# Patient Record
Sex: Female | Born: 2000 | Race: Black or African American | Hispanic: No | Marital: Single | Smoking: Never smoker
Health system: Southern US, Community
[De-identification: ages and names within clinical notes are randomized; demographics above are authoritative.]

## PROBLEM LIST (undated history)

## (undated) DIAGNOSIS — F329 Major depressive disorder, single episode, unspecified: Secondary | ICD-10-CM

## (undated) DIAGNOSIS — F32A Depression, unspecified: Secondary | ICD-10-CM

## (undated) DIAGNOSIS — F419 Anxiety disorder, unspecified: Secondary | ICD-10-CM

## (undated) HISTORY — PX: NO PAST SURGERIES: SHX2092

## (undated) HISTORY — DX: Major depressive disorder, single episode, unspecified: F32.9

## (undated) HISTORY — DX: Anxiety disorder, unspecified: F41.9

## (undated) HISTORY — DX: Depression, unspecified: F32.A

---
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
## 2018-04-12 ENCOUNTER — Ambulatory Visit (HOSPITAL_COMMUNITY)
Admission: EM | Admit: 2018-04-12 | Discharge: 2018-04-12 | Disposition: A | Discharge status: Home / Self Care | Payer: Medicaid Other | Attending: Family Medicine | Admitting: Family Medicine

## 2018-04-12 ENCOUNTER — Other Ambulatory Visit: Payer: Self-pay

## 2018-04-12 ENCOUNTER — Encounter (HOSPITAL_COMMUNITY): Payer: Self-pay | Admitting: *Deleted

## 2018-04-12 DIAGNOSIS — N760 Acute vaginitis: Secondary | ICD-10-CM

## 2018-04-12 DIAGNOSIS — Z79899 Other long term (current) drug therapy: Secondary | ICD-10-CM | POA: Diagnosis not present

## 2018-04-12 DIAGNOSIS — N898 Other specified noninflammatory disorders of vagina: Secondary | ICD-10-CM | POA: Diagnosis present

## 2018-04-12 NOTE — Discharge Instructions (Signed)
We are testing you today for yeast, bacteria, gonorrhea, chlamydia. Will notify you of any positive findings and if any changes to treatment are needed.   Avoid putting any soap or products in the vagina.  If symptoms worsen or do not improve in the next week to return to be seen or to follow up with your PCP.

## 2018-04-12 NOTE — ED Triage Notes (Signed)
C/o vaginal discharge with odor onset 2 weeks ago

## 2018-04-12 NOTE — ED Provider Notes (Signed)
[facility]    CSN: [id number] Arrival date & time: 04/12/18  1303     History   Chief Complaint Chief Complaint  Patient presents with  . Vaginal Discharge    HPI Tiffany Harris is a 17 y.o. female.   [name] presents with complaints of vaginal odor with some itching and discomfort which started at least 2 weeks ago. States has had clear vaginal discharge. Denies any previous similar. No sores or lesions. No vaginal bleeding. LMP 10/7 approximately. Last sexually active a month ago, used condoms. Denies concern for STD's. Normal urination. No new back pain. No pelvic pain. Has not tried any medications for symptoms. Doesn't have a PCP, denies any previous pelvic exams or PAP's. Without contributing medical history.      ROS per HPI.      Past Medical History:  Diagnosis Date  . Depression     Patient Active Problem List   Diagnosis Date Noted  . Chest pain 08/17/2017  . Chronic pain of both knees 08/17/2017    History reviewed. No pertinent surgical history.  OB History   None      Home Medications    Prior to Admission medications   Medication Sig Start Date End Date Taking? Authorizing Provider  fluticasone (FLONASE) 50 MCG/ACT nasal spray Place 2 sprays into both nostrils daily. 08/17/17   Claiborne Rigg, NP  ibuprofen (ADVIL,MOTRIN) 800 MG tablet Take 1 tablet (800 mg total) by mouth every 8 (eight) hours as needed. 08/17/17   Claiborne Rigg, NP  tiZANidine (ZANAFLEX) 2 MG tablet Take 1 tablet (2 mg total) by mouth every 6 (six) hours as needed for muscle spasms. 08/17/17   Claiborne Rigg, NP    Family History Family History  Problem Relation Age of Onset  . Hypertension Mother   . Hypertension Maternal Aunt   . Hypertension Paternal Aunt   . Diabetes Maternal Grandmother     Social History Social History   Tobacco Use  . Smoking status: Never Smoker  . Smokeless tobacco: Never Used  Substance Use Topics  . Alcohol use: No   . Drug use: No     Allergies   Pineapple flavor [flavoring agent]   Review of Systems Review of Systems   Physical Exam Triage Vital Signs ED Triage Vitals  Enc Vitals Group     BP 04/12/18 1400 116/64     Pulse Rate 04/12/18 1400 78     Resp 04/12/18 1400 18     Temp 04/12/18 1400 97.9 F (36.6 C)     Temp Source 04/12/18 1400 Oral     SpO2 04/12/18 1400 100 %     Weight --      Height --      Head Circumference --      Peak Flow --      Pain Score 04/12/18 1401 0     Pain Loc --      Pain Edu? --      Excl. in GC? --    No data found.  Updated Vital Signs BP 116/64 (BP Location: Right Arm)   Pulse 78   Temp 97.9 F (36.6 C) (Oral)   Resp 18   LMP 03/23/2018 (Exact Date)   SpO2 100%    Physical Exam  Constitutional: She is oriented to person, place, and time. She appears well-developed and well-nourished. No distress.  Cardiovascular: Normal rate, regular rhythm and normal heart sounds.  Pulmonary/Chest: Effort normal and breath  sounds normal.  Abdominal: Soft. There is no tenderness. There is no rigidity, no rebound, no guarding and no CVA tenderness.  Genitourinary:  Genitourinary Comments: Denies sores, lesions, vaginal bleeding; no pelvic pain; gu exam deferred at this time, vaginal self swab collected.    Neurological: She is alert and oriented to person, place, and time.  Skin: Skin is warm and dry.     UC Treatments / Results  Labs (all labs ordered are listed, but only abnormal results are displayed) Labs Reviewed  CERVICOVAGINAL ANCILLARY ONLY    EKG None  Radiology No results found.  Procedures Procedures (including critical care time)  Medications Ordered in UC Medications - No data to display  Initial Impression / Assessment and Plan / UC Course  I have reviewed the triage vital signs and the nursing notes.  Pertinent labs & imaging results that were available during my care of the patient were reviewed by me and considered  in my medical decision making (see chart for details).     Vaginal odor and irritation, denies any previous similar. Discharge has been clear per patient. No pelvic pain, fevers, denies concern for STD's. Vaginal cytology self swab collected and pending. Will treat based on these results. Will notify of any positive findings and if any changes to treatment are needed.  If symptoms worsen or do not improve in the next week to return to be seen or to follow up with PCP.  Patient verbalized understanding and agreeable to plan.   Final Clinical Impressions(s) / UC Diagnoses   Final diagnoses:  Acute vaginitis     Discharge Instructions     We are testing you today for yeast, bacteria, gonorrhea, chlamydia. Will notify you of any positive findings and if any changes to treatment are needed.   Avoid putting any soap or products in the vagina.  If symptoms worsen or do not improve in the next week to return to be seen or to follow up with your PCP.     ED Prescriptions    None     Controlled Substance Prescriptions [place] Controlled Substance Registry consulted? Not Applicable   Georgetta Haber, NP 04/12/18 1443

## 2018-04-13 LAB — CERVICOVAGINAL ANCILLARY ONLY
BACTERIAL VAGINITIS: POSITIVE — AB
CANDIDA VAGINITIS: POSITIVE — AB
CHLAMYDIA, DNA PROBE: NEGATIVE
NEISSERIA GONORRHEA: NEGATIVE
TRICH (WINDOWPATH): NEGATIVE

## 2018-04-14 ENCOUNTER — Telehealth: Payer: Self-pay | Admitting: Emergency Medicine

## 2018-04-14 MED ORDER — FLUCONAZOLE 150 MG PO TABS: ORAL_TABLET | ORAL | 0 refills | Status: DC

## 2018-04-14 MED ORDER — METRONIDAZOLE 500 MG PO TABS: 500.0000 mg | ORAL_TABLET | Freq: Two times a day (BID) | ORAL | 0 refills | Status: DC

## 2018-04-14 MED FILL — FLUCONAZOLE 150 MG TABS: 150 | 3 days supply | Qty: 2 | Fill #0

## 2018-04-14 MED FILL — metroNIDAZOLE 500 MG TABS: 500 | 7 days supply | Qty: 14 | Fill #0

## 2018-04-14 NOTE — Telephone Encounter (Signed)
Called patient, results communicated.  Patient voiced understanding and asked med.s to be sent to [facility].  Test for candida (yeast) was positive.  Prescription for fluconazole 150mg  po now, repeat dose in 3d if needed, #2 no refills, sent.  Recheck or followup with PCP for further evaluation if symptoms are not improving.  Bacterial vaginosis is positive. This was not treated at the urgent care visit.  Flagyl 500 mg BID x 7 days #14 no refills sent to patients pharmacy.

## 2018-06-09 ENCOUNTER — Encounter (HOSPITAL_COMMUNITY): Payer: Self-pay | Admitting: Emergency Medicine

## 2018-06-09 ENCOUNTER — Emergency Department (HOSPITAL_COMMUNITY)
Admission: EM | Admit: 2018-06-09 | Discharge: 2018-06-09 | Disposition: A | Discharge status: Home / Self Care | Payer: Medicaid Other | Attending: Emergency Medicine | Admitting: Emergency Medicine

## 2018-06-09 DIAGNOSIS — Z79899 Other long term (current) drug therapy: Secondary | ICD-10-CM | POA: Diagnosis not present

## 2018-06-09 DIAGNOSIS — R0981 Nasal congestion: Secondary | ICD-10-CM | POA: Diagnosis not present

## 2018-06-09 DIAGNOSIS — R51 Headache: Secondary | ICD-10-CM | POA: Diagnosis not present

## 2018-06-09 DIAGNOSIS — N10 Acute tubulo-interstitial nephritis: Secondary | ICD-10-CM

## 2018-06-09 DIAGNOSIS — R112 Nausea with vomiting, unspecified: Secondary | ICD-10-CM | POA: Diagnosis present

## 2018-06-09 DIAGNOSIS — R509 Fever, unspecified: Secondary | ICD-10-CM | POA: Diagnosis not present

## 2018-06-09 DIAGNOSIS — R110 Nausea: Secondary | ICD-10-CM | POA: Diagnosis not present

## 2018-06-09 DIAGNOSIS — R630 Anorexia: Secondary | ICD-10-CM | POA: Diagnosis not present

## 2018-06-09 LAB — URINALYSIS, ROUTINE W REFLEX MICROSCOPIC
Glucose, UA: NEGATIVE mg/dL
Hgb urine dipstick: NEGATIVE
KETONES UR: 20 mg/dL — AB
Nitrite: NEGATIVE
PROTEIN: 30 mg/dL — AB
Specific Gravity, Urine: 1.033 — ABNORMAL HIGH (ref 1.005–1.030)
pH: 6 (ref 5.0–8.0)

## 2018-06-09 MED ORDER — CEPHALEXIN 500 MG PO CAPS: 500.0000 mg | ORAL_CAPSULE | Freq: Four times a day (QID) | ORAL | 0 refills | Status: DC

## 2018-06-09 MED ORDER — LIDOCAINE HCL (PF) 1 % IJ SOLN
2.1000 mL | Freq: Once | INTRAMUSCULAR | Status: AC
  Administered 2018-06-09: 2.1 mL
  Filled 2018-06-09: qty 5

## 2018-06-09 MED ORDER — KETOROLAC TROMETHAMINE 60 MG/2ML IM SOLN
60.0000 mg | Freq: Once | INTRAMUSCULAR | Status: AC
  Administered 2018-06-09: 60 mg via INTRAMUSCULAR
  Filled 2018-06-09: qty 2

## 2018-06-09 MED ORDER — ONDANSETRON HCL 4 MG PO TABS: 4.0000 mg | ORAL_TABLET | Freq: Four times a day (QID) | ORAL | 0 refills | Status: DC

## 2018-06-09 MED ORDER — ONDANSETRON 4 MG PO TBDP
8.0000 mg | ORAL_TABLET | Freq: Once | ORAL | Status: AC
  Administered 2018-06-09: 8 mg via ORAL
  Filled 2018-06-09: qty 2

## 2018-06-09 MED ORDER — CEFTRIAXONE SODIUM 1 G IJ SOLR
1.0000 g | Freq: Once | INTRAMUSCULAR | Status: AC
  Administered 2018-06-09: 1 g via INTRAMUSCULAR
  Filled 2018-06-09: qty 10

## 2018-06-09 NOTE — ED Triage Notes (Signed)
Pt here with flu like symptoms times 3 days , fever today of 103, body aches , and some slight nausea

## 2018-06-10 LAB — URINE CULTURE

## 2018-06-11 NOTE — ED Provider Notes (Signed)
[facility] Provider Note   CSN: [id number] Arrival date & time: 06/09/18  [id number]     History   Chief Complaint Chief Complaint  Patient presents with  . Influenza    HPI Tiffany Harris is a 17 y.o. female.   Influenza  Presenting symptoms: fatigue, fever, headache, myalgias and nausea   Presenting symptoms: no cough   Onset quality:  Gradual Duration:  3 days Progression:  Worsening Chronicity:  New Relieved by:  Nothing Worsened by:  Nothing Ineffective treatments:  None tried Associated symptoms: chills, decreased appetite and nasal congestion   Fever   Associated symptoms include congestion and headaches. Pertinent negatives include no cough.    Past Medical History:  Diagnosis Date  . Depression     Patient Active Problem List   Diagnosis Date Noted  . Chest pain 08/17/2017  . Chronic pain of both knees 08/17/2017    History reviewed. No pertinent surgical history.   OB History   No obstetric history on file.      Home Medications    Prior to Admission medications   Medication Sig Start Date End Date Taking? Authorizing Provider  cephALEXin (KEFLEX) 500 MG capsule Take 1 capsule (500 mg total) by mouth 4 (four) times daily. 06/09/18   Graysyn Bache, Barbara CowerJason, MD  fluconazole (DIFLUCAN) 150 MG tablet Take 1 150 mg tablet by mouth today. If still symptomatic take additional 150 mg in 72 hours. 04/14/18   Eustace MooreNelson, Yvonne Sue, MD  fluticasone [facility](FLONASE) 50 MCG/ACT nasal spray Place 2 sprays into both nostrils daily. 08/17/17   Claiborne RiggFleming, Zelda W, NP  ibuprofen (ADVIL,MOTRIN) 800 MG tablet Take 1 tablet (800 mg total) by mouth every 8 (eight) hours as needed. 08/17/17   Claiborne RiggFleming, Zelda W, NP  metroNIDAZOLE (FLAGYL) 500 MG tablet Take 1 tablet (500 mg total) by mouth 2 (two) times daily. 04/14/18   Eustace MooreNelson, Yvonne Sue, MD  ondansetron (ZOFRAN) 4 MG tablet Take 1 tablet (4 mg total) by mouth every 6 (six) hours. 06/09/18   Bryley Kovacevic,  Barbara CowerJason, MD  tiZANidine (ZANAFLEX) 2 MG tablet Take 1 tablet (2 mg total) by mouth every 6 (six) hours as needed for muscle spasms. 08/17/17   Claiborne RiggFleming, Zelda W, NP    Family History Family History  Problem Relation Age of Onset  . Hypertension Mother   . Hypertension Maternal Aunt   . Hypertension Paternal Aunt   . Diabetes Maternal Grandmother     Social History Social History   Tobacco Use  . Smoking status: Never Smoker  . Smokeless tobacco: Never Used  Substance Use Topics  . Alcohol use: No  . Drug use: No     Allergies   Pineapple flavor [flavoring agent]   Review of Systems Review of Systems  Constitutional: Positive for chills, decreased appetite, fatigue and fever.  HENT: Positive for congestion.   Respiratory: Negative for cough.   Gastrointestinal: Positive for nausea.  Musculoskeletal: Positive for myalgias.  Neurological: Positive for headaches.  All other systems reviewed and are negative.    Physical Exam Updated Vital Signs BP 111/60   Pulse 90   Temp 98.6 F (37 C) (Oral)   Resp 11   SpO2 100%   Physical Exam Vitals signs and nursing note reviewed.  Constitutional:      Appearance: She is well-developed.  HENT:     Head: Normocephalic and atraumatic.  Eyes:     Extraocular Movements: Extraocular movements intact.     Conjunctiva/sclera:  Conjunctivae normal.     Pupils: Pupils are equal, round, and reactive to light.  Neck:     Musculoskeletal: Normal range of motion.  Cardiovascular:     Rate and Rhythm: Regular rhythm. Tachycardia present.  Pulmonary:     Effort: Pulmonary effort is normal. No respiratory distress.     Breath sounds: No stridor. No wheezing.  Abdominal:     General: Abdomen is flat. There is no distension.  Neurological:     Mental Status: She is alert.      ED Treatments / Results  Labs (all labs ordered are listed, but only abnormal results are displayed) Labs Reviewed  URINE CULTURE - Abnormal; Notable  for the following components:      Result Value   Culture MULTIPLE SPECIES PRESENT, SUGGEST RECOLLECTION (*)    All other components within normal limits  URINALYSIS, ROUTINE W REFLEX MICROSCOPIC - Abnormal; Notable for the following components:   Color, Urine AMBER (*)    APPearance CLOUDY (*)    Specific Gravity, Urine 1.033 (*)    Bilirubin Urine SMALL (*)    Ketones, ur 20 (*)    Protein, ur 30 (*)    Leukocytes, UA MODERATE (*)    Bacteria, UA MANY (*)    All other components within normal limits    EKG None  Radiology No results found.  Procedures Procedures (including critical care time)  Medications Ordered in ED Medications  ketorolac (TORADOL) injection 60 mg (60 mg Intramuscular Given 06/09/18 0611)  ondansetron (ZOFRAN-ODT) disintegrating tablet 8 mg (8 mg Oral Given 06/09/18 [id number])  cefTRIAXone (ROCEPHIN) injection 1 g (1 g Intramuscular Given 06/09/18 0735)  lidocaine (PF) (XYLOCAINE) 1 % injection 2.1 mL (2.1 mLs Other Given 06/09/18 0735)     Initial Impression / Assessment and Plan / ED Course  I have reviewed the triage vital signs and the nursing notes.  Pertinent labs & imaging results that were available during my care of the patient were reviewed by me and considered in my medical decision making (see chart for details).     UTI? ILI? Improved HR/fever and symptoms with treatments here. Urine culture sent. Started abx. Will dc on same.   Final Clinical Impressions(s) / ED Diagnoses   Final diagnoses:  Acute pyelonephritis    ED Discharge Orders         Ordered    cephALEXin (KEFLEX) 500 MG capsule  4 times daily     06/09/18 0725    ondansetron (ZOFRAN) 4 MG tablet  Every 6 hours     06/09/18 0725           Gradyn Shein, Barbara CowerJason, MD 06/11/18 [phone]

## 2018-07-15 DIAGNOSIS — Z1388 Encounter for screening for disorder due to exposure to contaminants: Secondary | ICD-10-CM | POA: Diagnosis not present

## 2018-07-15 DIAGNOSIS — Z3200 Encounter for pregnancy test, result unknown: Secondary | ICD-10-CM | POA: Diagnosis not present

## 2018-07-15 DIAGNOSIS — Z30430 Encounter for insertion of intrauterine contraceptive device: Secondary | ICD-10-CM | POA: Diagnosis not present

## 2018-07-15 DIAGNOSIS — Z0389 Encounter for observation for other suspected diseases and conditions ruled out: Secondary | ICD-10-CM | POA: Diagnosis not present

## 2018-07-15 DIAGNOSIS — Z3009 Encounter for other general counseling and advice on contraception: Secondary | ICD-10-CM | POA: Diagnosis not present

## 2018-07-15 DIAGNOSIS — Z113 Encounter for screening for infections with a predominantly sexual mode of transmission: Secondary | ICD-10-CM | POA: Diagnosis not present

## 2018-07-26 ENCOUNTER — Ambulatory Visit: Payer: Medicaid Other | Attending: Family Medicine

## 2018-07-26 DIAGNOSIS — Z111 Encounter for screening for respiratory tuberculosis: Secondary | ICD-10-CM

## 2018-07-26 NOTE — Progress Notes (Signed)
Patient arrived to clinic to receive tb skin test.   Patient was given tb skin test on right forearm.  Patient is to return in 48 hours to get test read.

## 2018-07-28 ENCOUNTER — Ambulatory Visit: Payer: Medicaid Other | Attending: Family Medicine | Admitting: *Deleted

## 2018-07-28 VITALS — Temp 98.7°F

## 2018-07-28 DIAGNOSIS — Z111 Encounter for screening for respiratory tuberculosis: Secondary | ICD-10-CM

## 2018-07-28 LAB — TB SKIN TEST
Induration: 0 mm
TB SKIN TEST: NEGATIVE

## 2019-04-20 IMAGING — CR DG CHEST 2V
2 series · 2 of 2 positions shown · non-contrast
Comparison: Radiographs March 20, 2013.

CLINICAL DATA: Chest pain.

EXAM:
CHEST  2 VIEW

[chest lat]
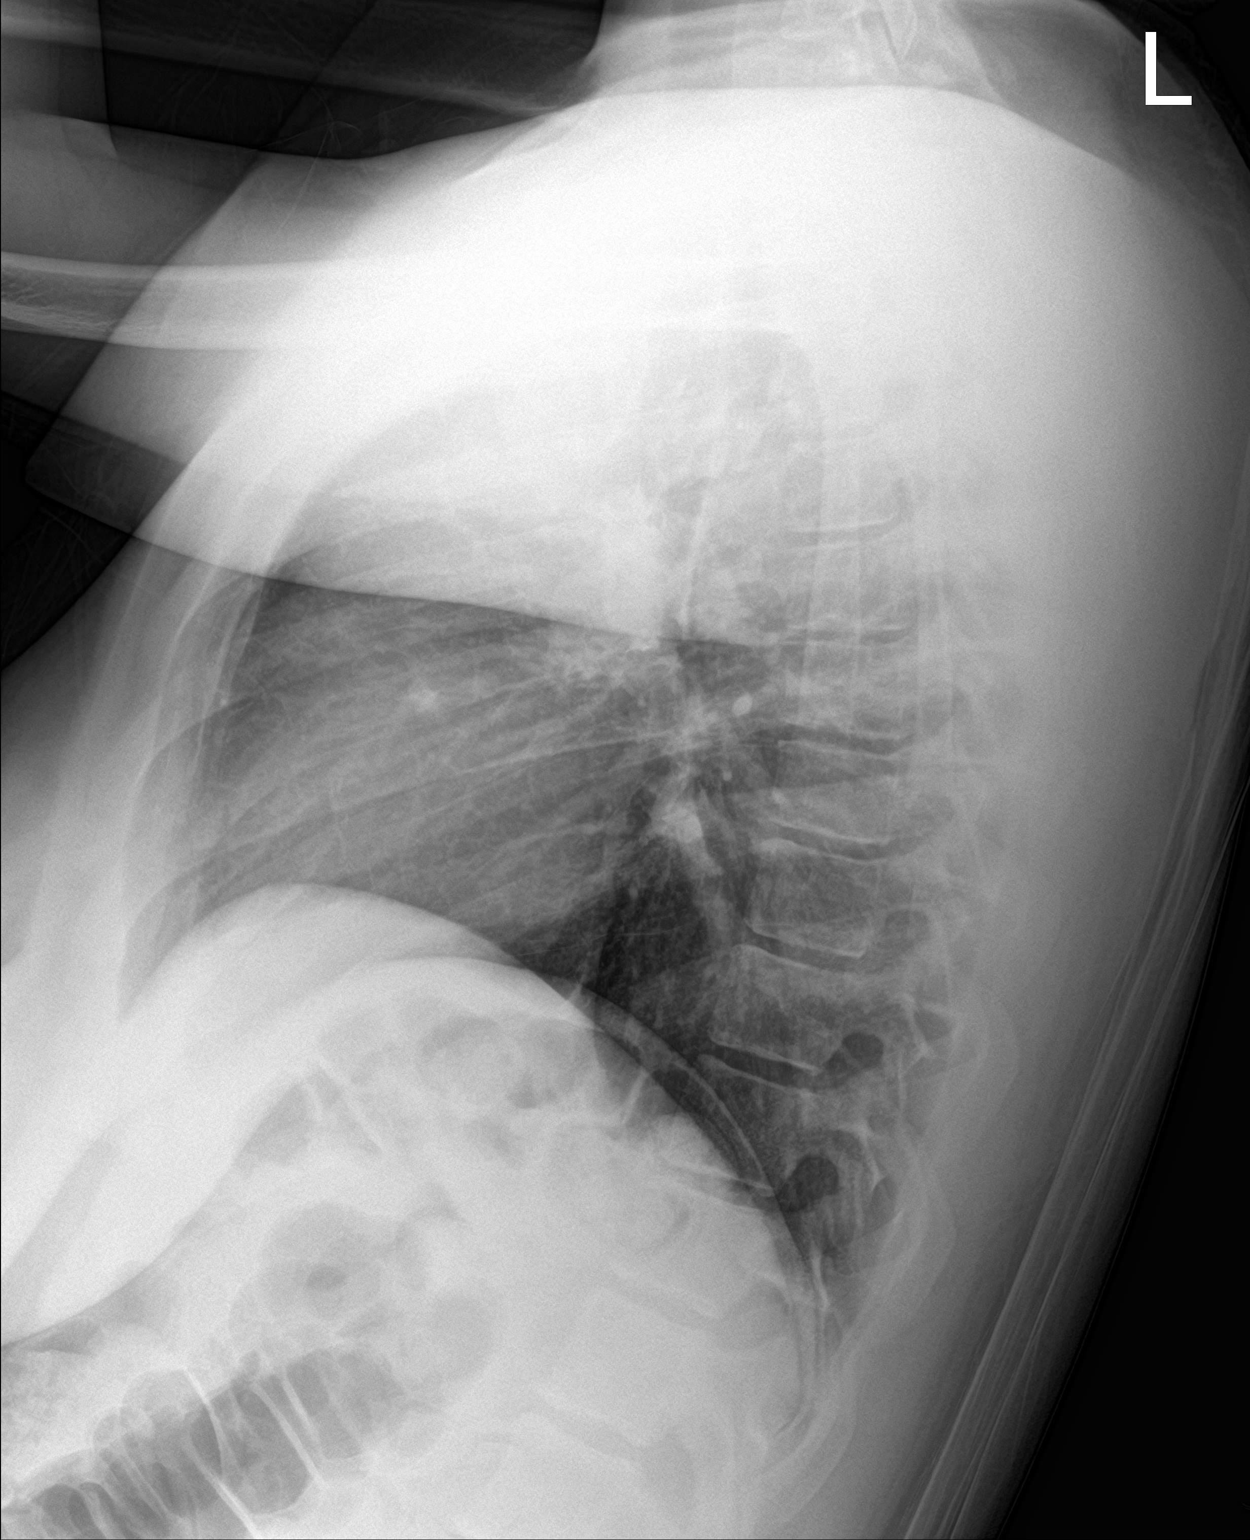

[chest ap]
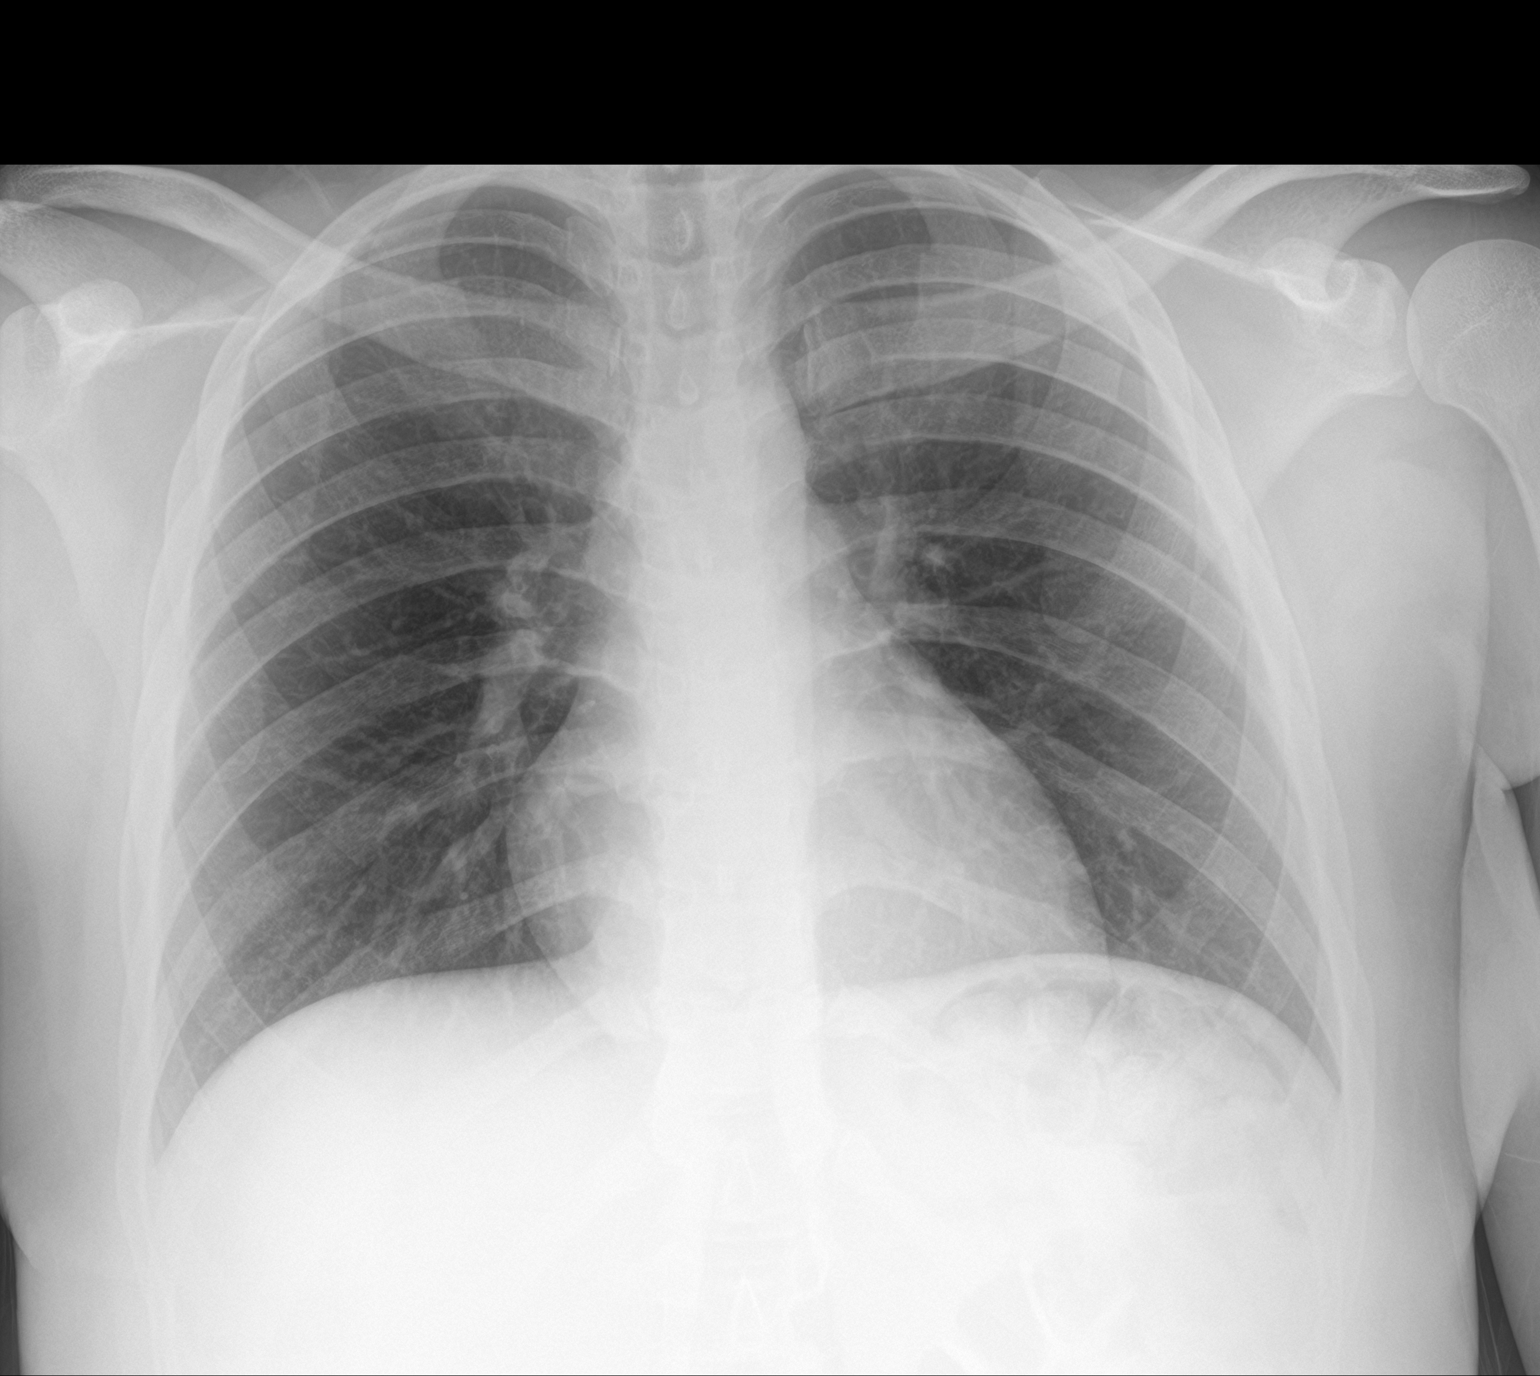

[2 of 2 positions shown; findings below may reference images not displayed]

FINDINGS: The heart size and mediastinal contours are within normal limits.
Both lungs are clear. No pneumothorax or pleural effusion is noted.
The visualized skeletal structures are unremarkable.
IMPRESSION: No active cardiopulmonary disease.

## 2019-12-14 DIAGNOSIS — F4310 Post-traumatic stress disorder, unspecified: Secondary | ICD-10-CM | POA: Diagnosis not present

## 2020-01-04 DIAGNOSIS — F4310 Post-traumatic stress disorder, unspecified: Secondary | ICD-10-CM | POA: Diagnosis not present

## 2020-01-09 DIAGNOSIS — F4310 Post-traumatic stress disorder, unspecified: Secondary | ICD-10-CM | POA: Diagnosis not present

## 2020-01-09 DIAGNOSIS — F331 Major depressive disorder, recurrent, moderate: Secondary | ICD-10-CM | POA: Diagnosis not present

## 2020-01-18 ENCOUNTER — Ambulatory Visit: Payer: Medicaid Other | Attending: Nurse Practitioner | Admitting: Nurse Practitioner

## 2020-01-18 ENCOUNTER — Encounter: Payer: Self-pay | Admitting: Nurse Practitioner

## 2020-01-18 ENCOUNTER — Other Ambulatory Visit: Payer: Self-pay

## 2020-01-18 VITALS — Ht 71.0 in | Wt 237.0 lb

## 2020-01-18 DIAGNOSIS — D509 Iron deficiency anemia, unspecified: Secondary | ICD-10-CM

## 2020-01-18 DIAGNOSIS — J3089 Other allergic rhinitis: Secondary | ICD-10-CM

## 2020-01-18 DIAGNOSIS — R7309 Other abnormal glucose: Secondary | ICD-10-CM

## 2020-01-18 DIAGNOSIS — Z114 Encounter for screening for human immunodeficiency virus [HIV]: Secondary | ICD-10-CM

## 2020-01-18 DIAGNOSIS — R0789 Other chest pain: Secondary | ICD-10-CM

## 2020-01-18 DIAGNOSIS — Z1159 Encounter for screening for other viral diseases: Secondary | ICD-10-CM

## 2020-01-18 DIAGNOSIS — N760 Acute vaginitis: Secondary | ICD-10-CM | POA: Diagnosis not present

## 2020-01-18 DIAGNOSIS — H9202 Otalgia, left ear: Secondary | ICD-10-CM

## 2020-01-18 MED ORDER — AMOXICILLIN-POT CLAVULANATE 875-125 MG PO TABS: 1.0000 | ORAL_TABLET | Freq: Two times a day (BID) | ORAL | 0 refills | Status: DC

## 2020-01-18 MED ORDER — FLUTICASONE PROPIONATE 50 MCG/ACT NA SUSP: 2.0000 | Freq: Every day | NASAL | 12 refills | Status: DC

## 2020-01-18 MED ORDER — TIZANIDINE HCL 2 MG PO TABS: 2.0000 mg | ORAL_TABLET | Freq: Three times a day (TID) | ORAL | 1 refills | Status: AC | PRN

## 2020-01-18 NOTE — Progress Notes (Signed)
Virtual Visit via Telephone Note Due to national recommendations of social distancing due to COVID 19, telehealth visit is felt to be most appropriate for this patient at this time.  I discussed the limitations, risks, security and privacy concerns of performing an evaluation and management service by telephone and the availability of in person appointments. I also discussed with the patient that there may be a patient responsible charge related to this service. The patient expressed understanding and agreed to proceed.    I connected with Tiffany Harris on 01/18/20  at   8:50 AM EDT  EDT by telephone and verified that I am speaking with the correct person using two identifiers.   Consent I discussed the limitations, risks, security and privacy concerns of performing an evaluation and management service by telephone and the availability of in person appointments. I also discussed with the patient that there may be a patient responsible charge related to this service. The patient expressed understanding and agreed to proceed.   Location of Patient: Private  Residence   Location of Provider: [facility]    Persons participating in Telemedicine visit: Bertram Denver FNP-BC YY [place] CMA Tiffany Harris    History of Present Illness: Telemedicine visit for: Re establish Care  Ear Pain Left Ear pain. She has tried several OTC treatments. Associated symptoms: increased yellow drainage from left ear. Onset of symptoms 6 months ago.    Vaginitis Vaginal Odor, white discharge and itching. Onset several months ago. She is currently not sexually active. She had the IUD inserted last year.   Chest Pain Right sided with No aggravating factors. Can last a few hours. She usually eats dinner around 4pm and avoids late night eating. She reports trying Tums once but can't recall if it provided relief. Chest pain resolves on its own.  Depression screen [facility] 2/9  01/18/2020 08/17/2017  Decreased Interest 0 2  Down, Depressed, Hopeless 0 2  PHQ - 2 Score 0 4  Altered sleeping 0 2  Tired, decreased energy 0 2  Change in appetite 0 2  Feeling bad or failure about yourself  1 2  Trouble concentrating 0 2  Moving slowly or fidgety/restless 0 2  Suicidal thoughts 0 2  PHQ-9 Score 1 18    Past Medical History:  Diagnosis Date  . Anxiety   . Depression     Past Surgical History:  Procedure Laterality Date  . NO PAST SURGERIES      Family History  Problem Relation Age of Onset  . Hypertension Mother   . Hypertension Maternal Aunt   . Hypertension Paternal Aunt   . Diabetes Maternal Grandmother     Social History   Socioeconomic History  . Marital status: Single    Spouse name: Not on file  . Number of children: Not on file  . Years of education: Not on file  . Highest education level: Not on file  Occupational History  . Not on file  Tobacco Use  . Smoking status: Never Smoker  . Smokeless tobacco: Never Used  Vaping Use  . Vaping Use: Never used  Substance and Sexual Activity  . Alcohol use: Yes    Comment: socially   . Drug use: No  . Sexual activity: Not Currently    Birth control/protection: I.U.D.  Other Topics Concern  . Not on file  Social History Narrative  . Not on file   Social Determinants of Health   Financial Resource Strain:   .  Difficulty of Paying Living Expenses:   Food Insecurity:   . Worried About Programme researcher, broadcasting/film/video in the Last Year:   . Barista in the Last Year:   Transportation Needs:   . Freight forwarder (Medical):   Marland Kitchen Lack of Transportation (Non-Medical):   Physical Activity:   . Days of Exercise per Week:   . Minutes of Exercise per Session:   Stress:   . Feeling of Stress :   Social Connections:   . Frequency of Communication with Friends and Family:   . Frequency of Social Gatherings with Friends and Family:   . Attends Religious Services:   . Active Member of Clubs or  Organizations:   . Attends Banker Meetings:   Marland Kitchen Marital Status:      Observations/Objective: Awake, alert and oriented x 3   Review of Systems  Constitutional: Negative for fever, malaise/fatigue and weight loss.  HENT: Positive for ear pain. Negative for nosebleeds.   Eyes: Negative.  Negative for blurred vision, double vision and photophobia.  Respiratory: Negative.  Negative for cough and shortness of breath.   Cardiovascular: Positive for chest pain. Negative for palpitations and leg swelling.  Gastrointestinal: Negative.  Negative for heartburn, nausea and vomiting.  Genitourinary:       SEE HPI  Musculoskeletal: Negative.  Negative for myalgias.  Neurological: Negative.  Negative for dizziness, focal weakness, seizures and headaches.  Psychiatric/Behavioral: Negative.  Negative for suicidal ideas.    Assessment and Plan: Tiffany Harris was seen today for establish care.  Diagnoses and all orders for this visit:  Acute vaginitis -     Cervicovaginal ancillary only; Future -     POCT URINALYSIS DIP (CLINITEK); Future  Encounter for hepatitis C screening test for low risk patient -     Hepatitis C Antibody; Future  Encounter for screening for HIV -     HIV antibody (with reflex); Future  Elevated glucose -     Hemoglobin A1c; Future  Iron deficiency anemia, unspecified iron deficiency anemia type -     CBC; Future  Allergic rhinitis due to other allergic trigger, unspecified seasonality -     fluticasone (FLONASE) 50 MCG/ACT nasal spray; Place 2 sprays into both nostrils daily.  Left ear pain -     amoxicillin-clavulanate (AUGMENTIN) 875-125 MG tablet; Take 1 tablet by mouth 2 (two) times daily. -     fluticasone (FLONASE) 50 MCG/ACT nasal spray; Place 2 sprays into both nostrils daily.  Other chest pain -     tiZANidine (ZANAFLEX) 2 MG tablet; Take 1 tablet (2 mg total) by mouth every 8 (eight) hours as needed for muscle spasms.     Follow Up  Instructions Return for PAP SMEAR.     I discussed the assessment and treatment plan with the patient. The patient was provided an opportunity to ask questions and all were answered. The patient agreed with the plan and demonstrated an understanding of the instructions.   The patient was advised to call back or seek an in-person evaluation if the symptoms worsen or if the condition fails to improve as anticipated.  I provided 18 minutes of non-face-to-face time during this encounter including median intraservice time, reviewing previous notes, labs, imaging, medications and explaining diagnosis and management.  Claiborne Rigg, FNP-BC

## 2020-01-19 DIAGNOSIS — F4310 Post-traumatic stress disorder, unspecified: Secondary | ICD-10-CM | POA: Diagnosis not present

## 2020-01-20 ENCOUNTER — Ambulatory Visit: Payer: Medicaid Other | Attending: Nurse Practitioner

## 2020-01-20 ENCOUNTER — Other Ambulatory Visit (HOSPITAL_COMMUNITY)
Admission: RE | Admit: 2020-01-20 | Discharge: 2020-01-20 | Disposition: A | Discharge status: Home / Self Care | Payer: Medicaid Other | Source: Ambulatory Visit | Attending: Nurse Practitioner | Admitting: Nurse Practitioner

## 2020-01-20 ENCOUNTER — Other Ambulatory Visit: Payer: Self-pay

## 2020-01-20 DIAGNOSIS — Z114 Encounter for screening for human immunodeficiency virus [HIV]: Secondary | ICD-10-CM

## 2020-01-20 DIAGNOSIS — R7309 Other abnormal glucose: Secondary | ICD-10-CM | POA: Diagnosis not present

## 2020-01-20 DIAGNOSIS — Z1159 Encounter for screening for other viral diseases: Secondary | ICD-10-CM

## 2020-01-20 DIAGNOSIS — D509 Iron deficiency anemia, unspecified: Secondary | ICD-10-CM | POA: Diagnosis not present

## 2020-01-20 DIAGNOSIS — N760 Acute vaginitis: Secondary | ICD-10-CM | POA: Diagnosis not present

## 2020-01-20 LAB — POCT URINALYSIS DIP (CLINITEK)
Bilirubin, UA: NEGATIVE
Blood, UA: NEGATIVE
Glucose, UA: NEGATIVE mg/dL
Ketones, POC UA: NEGATIVE mg/dL
Leukocytes, UA: NEGATIVE
Nitrite, UA: NEGATIVE
POC PROTEIN,UA: NEGATIVE
Spec Grav, UA: 1.02 (ref 1.010–1.025)
Urobilinogen, UA: 0.2 E.U./dL
pH, UA: 5.5 (ref 5.0–8.0)

## 2020-01-21 LAB — CBC
Hematocrit: 37 % (ref 34.0–46.6)
Hemoglobin: 12.1 g/dL (ref 11.1–15.9)
MCH: 26.1 pg — ABNORMAL LOW (ref 26.6–33.0)
MCHC: 32.7 g/dL (ref 31.5–35.7)
MCV: 80 fL (ref 79–97)
Platelets: 336 10*3/uL (ref 150–450)
RBC: 4.64 x10E6/uL (ref 3.77–5.28)
RDW: 13.7 % (ref 11.7–15.4)
WBC: 6.1 10*3/uL (ref 3.4–10.8)

## 2020-01-21 LAB — HIV ANTIBODY (ROUTINE TESTING W REFLEX): HIV Screen 4th Generation wRfx: NONREACTIVE

## 2020-01-21 LAB — HEMOGLOBIN A1C
Est. average glucose Bld gHb Est-mCnc: 123 mg/dL
Hgb A1c MFr Bld: 5.9 % — ABNORMAL HIGH (ref 4.8–5.6)

## 2020-01-21 LAB — HEPATITIS C ANTIBODY: Hep C Virus Ab: <0.1 s/co ratio (ref 0.0–0.9)

## 2020-01-23 LAB — CERVICOVAGINAL ANCILLARY ONLY
Bacterial Vaginitis (gardnerella): POSITIVE — AB
Candida Glabrata: NEGATIVE
Candida Vaginitis: POSITIVE — AB
Chlamydia: NEGATIVE
Comment: NEGATIVE
Comment: NEGATIVE
Comment: NEGATIVE
Comment: NEGATIVE
Comment: NEGATIVE
Comment: NORMAL
Neisseria Gonorrhea: NEGATIVE
Trichomonas: NEGATIVE

## 2020-01-29 ENCOUNTER — Other Ambulatory Visit: Payer: Self-pay | Admitting: Nurse Practitioner

## 2020-01-29 MED ORDER — FLUCONAZOLE 150 MG PO TABS
150.0000 mg | ORAL_TABLET | Freq: Once | ORAL | 0 refills | Status: AC
Start: 2020-01-29 — End: 2020-01-29

## 2020-01-29 MED ORDER — METRONIDAZOLE 500 MG PO TABS: 500.0000 mg | ORAL_TABLET | Freq: Two times a day (BID) | ORAL | 0 refills | Status: AC

## 2020-01-31 ENCOUNTER — Encounter: Payer: Self-pay | Admitting: Nurse Practitioner

## 2020-01-31 NOTE — Progress Notes (Signed)
Pt returned call for additional results, read to patient, verbalizes understanding. Aware prescriptions are at pharmacy.

## 2020-01-31 NOTE — Telephone Encounter (Signed)
This encounter was created in error - please disregard.

## 2020-02-02 DIAGNOSIS — F4310 Post-traumatic stress disorder, unspecified: Secondary | ICD-10-CM | POA: Diagnosis not present

## 2020-02-21 ENCOUNTER — Ambulatory Visit: Payer: Medicaid Other | Admitting: Nurse Practitioner

## 2020-03-30 ENCOUNTER — Other Ambulatory Visit (HOSPITAL_COMMUNITY)
Admission: RE | Admit: 2020-03-30 | Discharge: 2020-03-30 | Disposition: A | Discharge status: Home / Self Care | Payer: Medicaid Other | Source: Ambulatory Visit | Attending: Nurse Practitioner | Admitting: Nurse Practitioner

## 2020-03-30 ENCOUNTER — Encounter: Payer: Self-pay | Admitting: Nurse Practitioner

## 2020-03-30 ENCOUNTER — Other Ambulatory Visit: Payer: Self-pay

## 2020-03-30 ENCOUNTER — Ambulatory Visit: Payer: Medicaid Other | Attending: Nurse Practitioner | Admitting: Nurse Practitioner

## 2020-03-30 VITALS — BP 137/81 | HR 61 | Temp 97.7°F | Ht 71.0 in | Wt 238.0 lb

## 2020-03-30 DIAGNOSIS — M546 Pain in thoracic spine: Secondary | ICD-10-CM | POA: Diagnosis not present

## 2020-03-30 DIAGNOSIS — Z114 Encounter for screening for human immunodeficiency virus [HIV]: Secondary | ICD-10-CM | POA: Diagnosis not present

## 2020-03-30 DIAGNOSIS — F32A Depression, unspecified: Secondary | ICD-10-CM | POA: Diagnosis not present

## 2020-03-30 DIAGNOSIS — F419 Anxiety disorder, unspecified: Secondary | ICD-10-CM

## 2020-03-30 DIAGNOSIS — Z124 Encounter for screening for malignant neoplasm of cervix: Secondary | ICD-10-CM | POA: Insufficient documentation

## 2020-03-30 DIAGNOSIS — G8929 Other chronic pain: Secondary | ICD-10-CM | POA: Diagnosis not present

## 2020-03-30 DIAGNOSIS — N62 Hypertrophy of breast: Secondary | ICD-10-CM

## 2020-03-30 MED ORDER — ESCITALOPRAM OXALATE 10 MG PO TABS: 10.0000 mg | ORAL_TABLET | Freq: Every day | ORAL | 2 refills | Status: DC

## 2020-03-30 NOTE — Progress Notes (Signed)
Assessment & Plan:  Tiffany Harris was seen today for gynecologic exam.  Diagnoses and all orders for this visit:  Tiffany Harris was seen today for gynecologic exam.  Diagnoses and all orders for this visit:  Encounter for Papanicolaou smear for cervical cancer screening -     Cytology - PAP -     Cervicovaginal ancillary only  Encounter for screening for HIV -     Cancel: HIV antibody (with reflex)  Chronic bilateral thoracic back pain -     Ambulatory referral to Plastic Surgery Work on losing weight to help reduce back pain. May alternate with heat and ice application for pain relief. May also alternate with acetaminophen and Ibuprofen as prescribed for back pain. Other alternatives include massage, acupuncture and water aerobics.  You must stay active and avoid a sedentary lifestyle.  Large breasts -     Ambulatory referral to Plastic Surgery  Anxiety and depression -     escitalopram (LEXAPRO) 10 MG tablet; Take 1 tablet (10 mg total) by mouth daily.       Patient has been counseled on age-appropriate routine health concerns for screening and prevention. These are reviewed and up-to-date. Referrals have been placed accordingly. Immunizations are up-to-date or declined.    Subjective:   Chief Complaint  Patient presents with  . Gynecologic Exam    Pt. is here for a pap smear.    HPI Tiffany Harris 19 y.o. female presents to office today for PAP smear.     She has back and chest pain which she believes is due to her large breasts. She wears a 38G bra. Would like to speak with plastic surgery regarding breast reduction. Pain is relieved with NSAIDs and prn muscle relaxant.    Review of Systems  Constitutional: Negative.  Negative for chills, fever, malaise/fatigue and weight loss.  Respiratory: Negative.  Negative for cough, shortness of breath and wheezing.   Cardiovascular: Negative.  Negative for chest pain, orthopnea and leg swelling.  Gastrointestinal: Negative for  abdominal pain.  Genitourinary: Negative.  Negative for flank pain.  Musculoskeletal: Positive for back pain.  Skin: Negative.  Negative for rash.  Psychiatric/Behavioral: Negative for suicidal ideas.    Past Medical History:  Diagnosis Date  . Anxiety   . Depression     Past Surgical History:  Procedure Laterality Date  . NO PAST SURGERIES      Family History  Problem Relation Age of Onset  . Hypertension Mother   . Hypertension Maternal Aunt   . Hypertension Paternal Aunt   . Diabetes Maternal Grandmother     Social History Reviewed with no changes to be made today.   Outpatient Medications Prior to Visit  Medication Sig Dispense Refill  . fluticasone (FLONASE) 50 MCG/ACT nasal spray Place 2 sprays into both nostrils daily. 16 g 12  . hydrOXYzine (ATARAX/VISTARIL) 25 MG tablet Take 25 mg by mouth. One capsule up to twice a day as needed for anxiety    . tizanidine (ZANAFLEX) 2 MG capsule Take 2 mg by mouth every 8 (eight) hours.    Marland Kitchen amoxicillin-clavulanate (AUGMENTIN) 875-125 MG tablet Take 1 tablet by mouth 2 (two) times daily. 20 tablet 0  . escitalopram (LEXAPRO) 10 MG tablet Take 10 mg by mouth daily.    Marland Kitchen ibuprofen (ADVIL,MOTRIN) 800 MG tablet Take 1 tablet (800 mg total) by mouth every 8 (eight) hours as needed. 30 tablet 0   No facility-administered medications prior to visit.    Allergies  Allergen Reactions  . Pineapple Flavor [Flavoring Agent]     Face bumps and itchy       Objective:    BP 137/81 (BP Location: Left Arm, Patient Position: Sitting, Cuff Size: Normal)   Pulse 61   Temp 97.7 F (36.5 C) (Temporal)   Ht 5\' 11"  (1.803 m)   Wt 238 lb (108 kg)   SpO2 100%   BMI 33.19 kg/m  Wt Readings from Last 3 Encounters:  03/30/20 238 lb (108 kg)  01/18/20 (!) 237 lb (107.5 kg)  08/17/17 221 lb 6.4 oz (100.4 kg) (99 %, Z= 2.22)*   * Growth percentiles are based on CDC (Girls, 2-20 Years) data.    Physical Exam Exam conducted with a  chaperone present.  Constitutional:      Appearance: She is well-developed.  HENT:     Head: Normocephalic.  Cardiovascular:     Rate and Rhythm: Normal rate and regular rhythm.     Heart sounds: Normal heart sounds.  Pulmonary:     Effort: Pulmonary effort is normal.     Breath sounds: Normal breath sounds.  Abdominal:     General: Bowel sounds are normal.     Palpations: Abdomen is soft.     Hernia: There is no hernia in the left inguinal area.  Genitourinary:    Exam position: Lithotomy position.     Labia:        Right: No rash, tenderness, lesion or injury.        Left: No rash, tenderness, lesion or injury.      Vagina: Normal. No signs of injury and foreign body. No vaginal discharge, erythema, tenderness or bleeding.     Cervix: No cervical motion tenderness or friability.     Uterus: Not deviated and not enlarged.      Adnexa:        Right: No mass, tenderness or fullness.         Left: No mass, tenderness or fullness.       Rectum: Normal. No external hemorrhoid.     Lymphadenopathy:     Lower Body: No right inguinal adenopathy. No left inguinal adenopathy.  Skin:    General: Skin is warm and dry.  Neurological:     Mental Status: She is alert and oriented to person, place, and time.  Psychiatric:        Behavior: Behavior normal.        Thought Content: Thought content normal.        Judgment: Judgment normal.          Patient has been counseled extensively about nutrition and exercise as well as the importance of adherence with medications and regular follow-up. The patient was given clear instructions to go to ER or return to medical center if symptoms don't improve, worsen or new problems develop. The patient verbalized understanding.   Follow-up: Return in about 3 months (around 06/30/2020).   08/28/2020, FNP-BC [facility] [place], [place]   03/30/2020, 2:17 PM

## 2020-04-02 ENCOUNTER — Other Ambulatory Visit: Payer: Self-pay | Admitting: Nurse Practitioner

## 2020-04-02 LAB — CERVICOVAGINAL ANCILLARY ONLY
Bacterial Vaginitis (gardnerella): POSITIVE — AB
Candida Glabrata: NEGATIVE
Candida Vaginitis: POSITIVE — AB
Chlamydia: NEGATIVE
Comment: NEGATIVE
Comment: NEGATIVE
Comment: NEGATIVE
Comment: NEGATIVE
Comment: NEGATIVE
Comment: NORMAL
Neisseria Gonorrhea: NEGATIVE
Trichomonas: NEGATIVE

## 2020-04-02 LAB — CYTOLOGY - PAP: Diagnosis: NEGATIVE

## 2020-04-02 MED ORDER — METRONIDAZOLE 500 MG PO TABS: 500.0000 mg | ORAL_TABLET | Freq: Two times a day (BID) | ORAL | 0 refills | Status: AC

## 2020-04-02 MED ORDER — FLUCONAZOLE 150 MG PO TABS: 150.0000 mg | ORAL_TABLET | Freq: Once | ORAL | 0 refills | Status: AC

## 2020-04-17 ENCOUNTER — Other Ambulatory Visit: Payer: Self-pay | Admitting: Nurse Practitioner

## 2020-04-17 ENCOUNTER — Encounter: Payer: Self-pay | Admitting: Nurse Practitioner

## 2020-04-17 DIAGNOSIS — R399 Unspecified symptoms and signs involving the genitourinary system: Secondary | ICD-10-CM

## 2020-04-22 DIAGNOSIS — Z20822 Contact with and (suspected) exposure to covid-19: Secondary | ICD-10-CM | POA: Diagnosis not present

## 2020-04-23 ENCOUNTER — Encounter: Payer: Self-pay | Admitting: Nurse Practitioner

## 2020-04-23 ENCOUNTER — Other Ambulatory Visit: Payer: Self-pay | Admitting: Nurse Practitioner

## 2020-04-23 DIAGNOSIS — H9202 Otalgia, left ear: Secondary | ICD-10-CM

## 2020-04-23 DIAGNOSIS — J3089 Other allergic rhinitis: Secondary | ICD-10-CM

## 2020-04-23 MED ORDER — ONDANSETRON 4 MG PO TBDP: 4.0000 mg | ORAL_TABLET | Freq: Three times a day (TID) | ORAL | 0 refills | Status: DC | PRN

## 2020-04-23 MED ORDER — BENZONATATE 100 MG PO CAPS: 200.0000 mg | ORAL_CAPSULE | Freq: Three times a day (TID) | ORAL | 0 refills | Status: AC | PRN

## 2020-04-23 MED ORDER — FLUTICASONE PROPIONATE 50 MCG/ACT NA SUSP: 2.0000 | Freq: Every day | NASAL | 12 refills | Status: DC

## 2020-07-02 ENCOUNTER — Encounter: Payer: Self-pay | Admitting: Nurse Practitioner

## 2020-07-02 ENCOUNTER — Ambulatory Visit: Payer: Medicaid Other | Attending: Nurse Practitioner | Admitting: Nurse Practitioner

## 2020-07-02 ENCOUNTER — Other Ambulatory Visit: Payer: Self-pay

## 2020-07-03 ENCOUNTER — Other Ambulatory Visit: Payer: Self-pay | Admitting: Nurse Practitioner

## 2020-07-03 DIAGNOSIS — N761 Subacute and chronic vaginitis: Secondary | ICD-10-CM

## 2020-07-06 ENCOUNTER — Other Ambulatory Visit: Payer: Self-pay

## 2020-07-06 ENCOUNTER — Encounter: Payer: Self-pay | Admitting: Plastic Surgery

## 2020-07-06 ENCOUNTER — Ambulatory Visit (INDEPENDENT_AMBULATORY_CARE_PROVIDER_SITE_OTHER): Payer: Medicaid Other | Admitting: Plastic Surgery

## 2020-07-06 DIAGNOSIS — G8929 Other chronic pain: Secondary | ICD-10-CM

## 2020-07-06 DIAGNOSIS — M546 Pain in thoracic spine: Secondary | ICD-10-CM

## 2020-07-06 DIAGNOSIS — M549 Dorsalgia, unspecified: Secondary | ICD-10-CM | POA: Insufficient documentation

## 2020-07-06 DIAGNOSIS — N62 Hypertrophy of breast: Secondary | ICD-10-CM | POA: Insufficient documentation

## 2020-07-06 NOTE — Progress Notes (Signed)
Patient ID: Tiffany Harris, female    DOB: [birth year], 20 y.o.   MRN: [id number]   Chief Complaint  Patient presents with  . Advice Only    Mammary Hyperplasia: The patient is a 20 y.o. female with a history of mammary hyperplasia for several years.  She has extremely large breasts causing symptoms that include the following: Back pain in the upper and lower back, including neck pain. She pulls or pins her bra straps to provide better lift and relief of the pressure and pain. She notices relief by holding her breast up manually.  Her shoulder straps cause grooves and pain and pressure that requires padding for relief. Pain medication is sometimes required with motrin and tylenol.  Activities that are hindered by enlarged breasts include: exercise and running.  She has tried supportive clothing as well as fitted bras without improvement.  Her breasts are extremely large and fairly symmetric with the right slightly longer.  She has hyperpigmentation of the inframammary area on both sides.  The sternal to nipple distance on the right is 35 cm and the left is 36 cm.  The IMF distance is 20 cm.  She is 5 feet 11 inches tall and weighs 245 pounds.  Preoperative bra size = 42G cup. She would like to be a C cup the estimated excess breast tissue to be removed at the time of surgery = 1000 grams on the left and 1000 grams on the right.  Mammogram history: none.  Family history of breast cancer:  Paternal aunt.  Tobacco use:  none.     Review of Systems  Constitutional: Negative.  Negative for activity change.  HENT: Negative.   Eyes: Negative.   Respiratory: Negative.   Cardiovascular: Negative.   Gastrointestinal: Negative.   Endocrine: Negative.   Genitourinary: Negative.   Musculoskeletal: Positive for back pain and neck pain.  Hematological: Negative.   Psychiatric/Behavioral: Negative.     Past Medical History:  Diagnosis Date  . Anxiety   . Depression     Past Surgical History:   Procedure Laterality Date  . NO PAST SURGERIES        Current Outpatient Medications:  .  benzonatate (TESSALON) 100 MG capsule, Take 2 capsules (200 mg total) by mouth 3 (three) times daily as needed for cough., Disp: 30 capsule, Rfl: 0 .  escitalopram (LEXAPRO) 10 MG tablet, Take 1 tablet (10 mg total) by mouth daily., Disp: 90 tablet, Rfl: 2 .  fluticasone (FLONASE) 50 MCG/ACT nasal spray, Place 2 sprays into both nostrils daily., Disp: 16 g, Rfl: 12 .  hydrOXYzine (ATARAX/VISTARIL) 25 MG tablet, Take 25 mg by mouth. One capsule up to twice a day as needed for anxiety, Disp: , Rfl:  .  tizanidine (ZANAFLEX) 2 MG capsule, Take 2 mg by mouth every 8 (eight) hours., Disp: , Rfl:    Objective:   Vitals:   07/06/20 0854  BP: 118/78  Pulse: 92  SpO2: 98%    Physical Exam Vitals and nursing note reviewed.  Constitutional:      Appearance: Normal appearance.  HENT:     Head: Normocephalic and atraumatic.  Eyes:     Extraocular Movements: Extraocular movements intact.  Cardiovascular:     Rate and Rhythm: Normal rate.     Pulses: Normal pulses.  Pulmonary:     Effort: Pulmonary effort is normal. No respiratory distress.  Abdominal:     General: Abdomen is flat. There is no distension.  Tenderness: There is no abdominal tenderness.  Neurological:     General: No focal deficit present.     Mental Status: She is alert and oriented to person, place, and time.  Psychiatric:        Mood and Affect: Mood normal.        Behavior: Behavior normal.        Thought Content: Thought content normal.     Assessment & Plan:  Symptomatic mammary hypertrophy  Chronic bilateral thoracic back pain  We will send her for physical therapy. We will also email her a brochure for breast reduction. She is a good candidate for bilateral breast reduction with possible liposuction. The patient knows to call us when she has finished with her physical therapy.  Pictures were obtained of the  patient and placed in the chart with the patient's or guardian's permission.  Alena Bills Margaux Engen, DO

## 2020-11-24 ENCOUNTER — Other Ambulatory Visit: Payer: Self-pay | Admitting: Nurse Practitioner

## 2020-11-24 DIAGNOSIS — H9202 Otalgia, left ear: Secondary | ICD-10-CM

## 2020-11-25 NOTE — Telephone Encounter (Signed)
Requested medication (s) are due for refill today: yes  Requested medication (s) are on the active medication list: no  Last refill:  04/02/20  Future visit scheduled: no  Notes to clinic:  med not assigned to protocol   Requested Prescriptions  Pending Prescriptions Disp Refills   metroNIDAZOLE (FLAGYL) 500 MG tablet [Pharmacy Med Name: METRONIDAZOLE 500 MG TABLET] 14 tablet     Sig: TAKE 1 TABLET BY MOUTH TWICE A DAY FOR 7 DAYS      Off-Protocol Failed - 11/24/2020 11:17 PM      Failed - Medication not assigned to a protocol, review manually.      Passed - Valid encounter within last 12 months    Recent Outpatient Visits           8 months ago Encounter for Papanicolaou smear for cervical cancer screening   [facility] [place], Shea Stakes, NP   10 months ago Acute vaginitis   [facility] [place], Shea Stakes, NP   3 years ago Chest wall pain   [facility] [place], Shea Stakes, NP                 Refused Prescriptions Disp Refills   amoxicillin-clavulanate (AUGMENTIN) 875-125 MG tablet [Pharmacy Med Name: AMOXICILLIN-CLAV 875-125MG  TAB] 20 tablet     Sig: TAKE 1 TABLET BY MOUTH TWICE A DAY      Off-Protocol Failed - 11/24/2020 11:17 PM      Failed - Medication not assigned to a protocol, review manually.      Passed - Valid encounter within last 12 months    Recent Outpatient Visits           8 months ago Encounter for Papanicolaou smear for cervical cancer screening   [facility] [place], Shea Stakes, NP   10 months ago Acute vaginitis   [facility] [place], Shea Stakes, NP   3 years ago Chest wall pain   [facility] [place], Shea Stakes, NP

## 2020-11-25 NOTE — Telephone Encounter (Signed)
Requested Prescriptions  Pending Prescriptions Disp Refills  . amoxicillin-clavulanate (AUGMENTIN) 875-125 MG tablet [Pharmacy Med Name: AMOXICILLIN-CLAV 875-125MG  TAB] 20 tablet     Sig: TAKE 1 TABLET BY MOUTH TWICE A DAY     Off-Protocol Failed - 11/24/2020 11:17 PM      Failed - Medication not assigned to a protocol, review manually.      Passed - Valid encounter within last 12 months    Recent Outpatient Visits          8 months ago Encounter for Papanicolaou smear for cervical cancer screening   [facility] [place], Shea Stakes, NP   10 months ago Acute vaginitis   [facility] [place], [place] W, NP   3 years ago Chest wall pain   [facility] [place], [place] W, NP             . metroNIDAZOLE (FLAGYL) 500 MG tablet [Pharmacy Med Name: METRONIDAZOLE 500 MG TABLET] 14 tablet     Sig: TAKE 1 TABLET BY MOUTH TWICE A DAY FOR 7 DAYS     Off-Protocol Failed - 11/24/2020 11:17 PM      Failed - Medication not assigned to a protocol, review manually.      Passed - Valid encounter within last 12 months    Recent Outpatient Visits          8 months ago Encounter for Papanicolaou smear for cervical cancer screening   [facility] [place], Shea Stakes, NP   10 months ago Acute vaginitis   [facility] [place], Shea Stakes, NP   3 years ago Chest wall pain   [facility] [place], Shea Stakes, NP

## 2020-11-26 ENCOUNTER — Encounter: Payer: Self-pay | Admitting: Nurse Practitioner

## 2020-12-17 ENCOUNTER — Encounter: Payer: Self-pay | Admitting: Nurse Practitioner

## 2020-12-18 ENCOUNTER — Telehealth (INDEPENDENT_AMBULATORY_CARE_PROVIDER_SITE_OTHER): Payer: Medicaid Other | Admitting: Nurse Practitioner

## 2020-12-18 DIAGNOSIS — U071 COVID-19: Secondary | ICD-10-CM

## 2020-12-18 MED ORDER — MOLNUPIRAVIR EUA 200MG CAPSULE: 4.0000 | ORAL_CAPSULE | Freq: Two times a day (BID) | ORAL | 0 refills | Status: AC

## 2020-12-18 NOTE — Progress Notes (Signed)
Virtual Visit via Telephone Note  I connected with Tiffany Harris on 12/18/20 at  9:30 AM EDT by telephone and verified that I am speaking with the correct person using two identifiers.  Location: Patient: home Provider: office   I discussed the limitations, risks, security and privacy concerns of performing an evaluation and management service by telephone and the availability of in person appointments. I also discussed with the patient that there may be a patient responsible charge related to this service. The patient expressed understanding and agreed to proceed.   History of Present Illness:  Patient presents today for a televisit for post-COVID care clinic.  Patient tested positive yesterday for COVID.  She states that her symptoms started on 12/17/2018.  She states that she is having congestion, chills, back pain, fever. She has not taken any prescriptions. She has taken tylenol with minimal relief noted.      Observations/Objective:  Vitals with BMI 07/06/2020 03/30/2020 01/18/2020  Height 5\' 11"  5\' 11"  5\' 11"   Weight 245 lbs 6 oz 238 lbs 237 lbs  BMI 34.24 33.21 33.07  Systolic 118 137 -  Diastolic 78 81 -  Pulse 92 61 -      Assessment and Plan:   Covid 19:   Stay well hydrated  Stay active  Deep breathing exercises  May take tylenol for fever or pain  You are being prescribed MOLNUPIRAVIR for COVID-19 infection.    Please call the pharmacy or go through the drive through vs going inside if you are picking up the mediation yourself to prevent further spread. If prescribed to a [facility] affiliated pharmacy, a pharmacist will bring the medication out to your car.   ADMINISTRATION INSTRUCTIONS: Take with or without food. Swallow the tablets whole. Don't chew, crush, or break the medications because it might not work as well  For each dose of the medication, you should be taking FOUR tablets at one time, TWICE a day   Finish your full five-day course of  Molnupiravir even if you feel better before you're done. Stopping this medication too early can make it less effective to prevent severe illness related to COVID19.    Molnupiravir is prescribed for YOU ONLY. Don't share it with others, even if they have similar symptoms as you. This medication might not be right for everyone.   Make sure to take steps to protect yourself and others while you're taking this medication in order to get well soon and to prevent others from getting sick with COVID-19.   **If you are of childbearing potential (any gender) - it is advised to not get pregnant while taking this medication and recommended that condoms are used for female partners the next 3 months after taking the medication out of extreme caution    COMMON SIDE EFFECTS: Diarrhea Nausea  Dizziness    If your COVID-19 symptoms get worse, get medical help right away. Call 911 if you experience symptoms such as worsening cough, trouble breathing, chest pain that doesn't go away, confusion, a hard time staying awake, and pale or blue-colored skin. This medication won't prevent all COVID-19 cases from getting worse.     I discussed the assessment and treatment plan with the patient. The patient was provided an opportunity to ask questions and all were answered. The patient agreed with the plan and demonstrated an understanding of the instructions.   The patient was advised to call back or seek an in-person evaluation if the symptoms worsen or if the  condition fails to improve as anticipated.  I provided 23 minutes of non-face-to-face time during this encounter.   Fenton Foy, NP

## 2020-12-18 NOTE — Patient Instructions (Addendum)
Covid 19:   Stay well hydrated  Stay active  Deep breathing exercises  May take tylenol for fever or pain  You are being prescribed MOLNUPIRAVIR for COVID-19 infection.    Please call the pharmacy or go through the drive through vs going inside if you are picking up the mediation yourself to prevent further spread. If prescribed to a [facility] affiliated pharmacy, a pharmacist will bring the medication out to your car.   ADMINISTRATION INSTRUCTIONS: Take with or without food. Swallow the tablets whole. Don't chew, crush, or break the medications because it might not work as well  For each dose of the medication, you should be taking FOUR tablets at one time, TWICE a day   Finish your full five-day course of Molnupiravir even if you feel better before you're done. Stopping this medication too early can make it less effective to prevent severe illness related to COVID19.    Molnupiravir is prescribed for YOU ONLY. Don't share it with others, even if they have similar symptoms as you. This medication might not be right for everyone.   Make sure to take steps to protect yourself and others while you're taking this medication in order to get well soon and to prevent others from getting sick with COVID-19.   **If you are of childbearing potential (any gender) - it is advised to not get pregnant while taking this medication and recommended that condoms are used for female partners the next 3 months after taking the medication out of extreme caution    COMMON SIDE EFFECTS: Diarrhea Nausea  Dizziness    If your COVID-19 symptoms get worse, get medical help right away. Call 911 if you experience symptoms such as worsening cough, trouble breathing, chest pain that doesn't go away, confusion, a hard time staying awake, and pale or blue-colored skin. This medication won't prevent all COVID-19 cases from getting worse.      Person Under Monitoring Name: Tiffany Harris  Location:  [address] [place] [address]   Infection Prevention Recommendations for Individuals Confirmed to have, or Being Evaluated for, 2019 Novel Coronavirus (COVID-19) Infection Who Receive Care at Home  Individuals who are confirmed to have, or are being evaluated for, COVID-19 should follow the prevention steps below until a healthcare provider or local or state health department says they can return to normal activities.  Stay home except to get medical care You should restrict activities outside your home, except for getting medical care. Do not go to work, school, or public areas, and do not use public transportation or taxis.  Call ahead before visiting your doctor Before your medical appointment, call the healthcare provider and tell them that you have, or are being evaluated for, COVID-19 infection. This will help the healthcare provider's office take steps to keep other people from getting infected. Ask your healthcare provider to call the local or state health department.  Monitor your symptoms Seek prompt medical attention if your illness is worsening (e.g., difficulty breathing). Before going to your medical appointment, call the healthcare provider and tell them that you have, or are being evaluated for, COVID-19 infection. Ask your healthcare provider to call the local or state health department.  Wear a facemask You should wear a facemask that covers your nose and mouth when you are in the same room with other people and when you visit a healthcare provider. People who live with or visit you should also wear a facemask while they are in the same  room with you.  Separate yourself from other people in your home As much as possible, you should stay in a different room from other people in your home. Also, you should use a separate bathroom, if available.  Avoid sharing household items You should not share dishes, drinking glasses, cups, eating utensils, towels,  bedding, or other items with other people in your home. After using these items, you should wash them thoroughly with soap and water.  Cover your coughs and sneezes Cover your mouth and nose with a tissue when you cough or sneeze, or you can cough or sneeze into your sleeve. Throw used tissues in a lined trash can, and immediately wash your hands with soap and water for at least 20 seconds or use an alcohol-based hand rub.  Wash your [employer] your hands often and thoroughly with soap and water for at least 20 seconds. You can use an alcohol-based hand sanitizer if soap and water are not available and if your hands are not visibly dirty. Avoid touching your eyes, nose, and mouth with unwashed hands.   Prevention Steps for Caregivers and Household Members of Individuals Confirmed to have, or Being Evaluated for, COVID-19 Infection Being Cared for in the Home  If you live with, or provide care at home for, a person confirmed to have, or being evaluated for, COVID-19 infection please follow these guidelines to prevent infection:  Follow healthcare provider's instructions Make sure that you understand and can help the patient follow any healthcare provider instructions for all care.  Provide for the patient's basic needs You should help the patient with basic needs in the home and provide support for getting groceries, prescriptions, and other personal needs.  Monitor the patient's symptoms If they are getting sicker, call his or her medical provider and tell them that the patient has, or is being evaluated for, COVID-19 infection. This will help the healthcare provider's office take steps to keep other people from getting infected. Ask the healthcare provider to call the local or state health department.  Limit the number of people who have contact with the patient If possible, have only one caregiver for the patient. Other household members should stay in another home or place of  residence. If this is not possible, they should stay in another room, or be separated from the patient as much as possible. Use a separate bathroom, if available. Restrict visitors who do not have an essential need to be in the home.  Keep older adults, very young children, and other sick people away from the patient Keep older adults, very young children, and those who have compromised immune systems or chronic health conditions away from the patient. This includes people with chronic heart, lung, or kidney conditions, diabetes, and cancer.  Ensure good ventilation Make sure that shared spaces in the home have good air flow, such as from an air conditioner or an opened window, weather permitting.  Wash your hands often Wash your hands often and thoroughly with soap and water for at least 20 seconds. You can use an alcohol based hand sanitizer if soap and water are not available and if your hands are not visibly dirty. Avoid touching your eyes, nose, and mouth with unwashed hands. Use disposable paper towels to dry your hands. If not available, use dedicated cloth towels and replace them when they become wet.  Wear a facemask and gloves Wear a disposable facemask at all times in the room and gloves when you touch or have  contact with the patient's blood, body fluids, and/or secretions or excretions, such as sweat, saliva, sputum, nasal mucus, vomit, urine, or feces.  Ensure the mask fits over your nose and mouth tightly, and do not touch it during use. Throw out disposable facemasks and gloves after using them. Do not reuse. Wash your hands immediately after removing your facemask and gloves. If your personal clothing becomes contaminated, carefully remove clothing and launder. Wash your hands after handling contaminated clothing. Place all used disposable facemasks, gloves, and other waste in a lined container before disposing them with other household waste. Remove gloves and wash your hands  immediately after handling these items.  Do not share dishes, glasses, or other household items with the patient Avoid sharing household items. You should not share dishes, drinking glasses, cups, eating utensils, towels, bedding, or other items with a patient who is confirmed to have, or being evaluated for, COVID-19 infection. After the person uses these items, you should wash them thoroughly with soap and water.  Wash laundry thoroughly Immediately remove and wash clothes or bedding that have blood, body fluids, and/or secretions or excretions, such as sweat, saliva, sputum, nasal mucus, vomit, urine, or feces, on them. Wear gloves when handling laundry from the patient. Read and follow directions on labels of laundry or clothing items and detergent. In general, wash and dry with the warmest temperatures recommended on the label.  Clean all areas the individual has used often Clean all touchable surfaces, such as counters, tabletops, doorknobs, bathroom fixtures, toilets, phones, keyboards, tablets, and bedside tables, every day. Also, clean any surfaces that may have blood, body fluids, and/or secretions or excretions on them. Wear gloves when cleaning surfaces the patient has come in contact with. Use a diluted bleach solution (e.g., dilute bleach with 1 part bleach and 10 parts water) or a household disinfectant with a label that says EPA-registered for coronaviruses. To make a bleach solution at home, add 1 tablespoon of bleach to 1 quart (4 cups) of water. For a larger supply, add  cup of bleach to 1 gallon (16 cups) of water. Read labels of cleaning products and follow recommendations provided on product labels. Labels contain instructions for safe and effective use of the cleaning product including precautions you should take when applying the product, such as wearing gloves or eye protection and making sure you have good ventilation during use of the product. Remove gloves and wash hands  immediately after cleaning.  Monitor yourself for signs and symptoms of illness Caregivers and household members are considered close contacts, should monitor their health, and will be asked to limit movement outside of the home to the extent possible. Follow the monitoring steps for close contacts listed on the symptom monitoring form.   ? If you have additional questions, contact your local health department or call the epidemiologist on call at [phone] (available 24/7). ? This guidance is subject to change. For the most up-to-date guidance from [facility], please refer to their website: [email or web address]     Follow up:  Follow up in 2 weeks if needed

## 2021-02-18 ENCOUNTER — Ambulatory Visit: Payer: Self-pay | Admitting: *Deleted

## 2021-02-18 NOTE — Telephone Encounter (Signed)
Patient called wanting to speak with a nurse regarding cloudy urine and vaginal itching. No answer and unable to leave a VM when returning her call twice. Routing to the clinic for follow up

## 2021-02-18 NOTE — Telephone Encounter (Signed)
Patient called, phone ringing, no answer, no voicemail pick up. Unable to reach patient after 3 attempts by [facility] NT, routing to the provider for resolution per protocol.   Message from [employer] sent at 02/18/2021  2:45 PM EDT  Summary: urinary discomfort   Patient would like to be contacted regarding urinary discomfort   The patient has noticed cloudy  urine as well as an itching sensation that they've experienced for roughly three weeks   Please contact to discuss further when possible   Patient may be unable to answer due to work but it is okay to leave a voicemail or call on 02/19/21

## 2021-02-19 ENCOUNTER — Ambulatory Visit: Payer: Self-pay | Admitting: *Deleted

## 2021-02-19 NOTE — Telephone Encounter (Signed)
Reason for Disposition  Bad smelling vaginal discharge  Answer Assessment - Initial Assessment Questions 1. DISCHARGE: "Describe the discharge." (e.g., white, yellow, green, gray, foamy, cottage cheese-like)     White, chunky 2. ODOR: "Is there a bad odor?"     yes 3. ONSET: "When did the discharge begin?"     3 weeks 4. RASH: "Is there a rash in that area?" If Yes, ask: "Describe it." (e.g., redness, blisters, sores, bumps)     no 5. ABDOMINAL PAIN: "Are you having any abdominal pain?" If Yes, ask: "What does it feel like? " (e.g., crampy, dull, intermittent, constant)      Yes- left ovarian pain 6. ABDOMINAL PAIN SEVERITY: If present, ask: "How bad is it?"  (e.g., mild, moderate, severe)  - MILD - doesn't interfere with normal activities   - MODERATE - interferes with normal activities or awakens from sleep   - SEVERE - patient doesn't want to move (R/O peritonitis)      Mild/moderate 7. CAUSE: "What do you think is causing the discharge?" "Have you had the same problem before? What happened then?"     Yeast/BV, IUD 8. OTHER SYMPTOMS: "Do you have any other symptoms?" (e.g., fever, itching, vaginal bleeding, pain with urination, injury to genital area, vaginal foreign body)     Vaginal bleeding, urinary symptoms 9. PREGNANCY: "Is there any chance you are pregnant?" "When was your last menstrual period?"     No- IUD  Protocols used: Vaginal Discharge-A-AH

## 2021-02-19 NOTE — Telephone Encounter (Signed)
Pt has been set up for a nurse visit to drop off a urine sample and do a vaginal swab.

## 2021-02-20 ENCOUNTER — Ambulatory Visit: Payer: Medicaid Other

## 2021-03-27 ENCOUNTER — Encounter: Payer: Medicaid Other | Admitting: Nurse Practitioner

## 2021-04-16 ENCOUNTER — Encounter: Payer: Medicaid Other | Admitting: Obstetrics & Gynecology

## 2021-08-23 ENCOUNTER — Ambulatory Visit: Payer: Self-pay | Admitting: *Deleted

## 2021-08-23 NOTE — Telephone Encounter (Signed)
Reason for Disposition ? Abdominal pain is a chronic symptom (recurrent or ongoing AND present > 4 weeks) ? ?Answer Assessment - Initial Assessment Questions ?1. LOCATION: "Where does it hurt?"  ?    It's been several weeks I'm having abd pain.   Having bloated, cramping on upper part of stomach   I'm tired all time.   No recent illness.     No diarrhea or vomiting.   ?2. RADIATION: "Does the pain shoot anywhere else?" (e.g., chest, back) ?    Radiates to left part of stomach.   ?3. ONSET: "When did the pain begin?" (e.g., minutes, hours or days ago)  ?    Several weeks it's swelling above my belly button. ?4. SUDDEN: "Gradual or sudden onset?" ?    *No Answer* ?5. PATTERN "Does the pain come and go, or is it constant?" ?   - If constant: "Is it getting better, staying the same, or worsening?"  ?    (Note: Constant means the pain never goes away completely; most serious pain is constant and it progresses)  ?   - If intermittent: "How long does it last?" "Do you have pain now?" ?    (Note: Intermittent means the pain goes away completely between bouts) ?    *No Answer* ?6. SEVERITY: "How bad is the pain?"  (e.g., Scale 1-10; mild, moderate, or severe) ?  - MILD (1-3): doesn't interfere with normal activities, abdomen soft and not tender to touch  ?  - MODERATE (4-7): interferes with normal activities or awakens from sleep, abdomen tender to touch  ?  - SEVERE (8-10): excruciating pain, doubled over, unable to do any normal activities  ?    *No Answer* ?7. RECURRENT SYMPTOM: "Have you ever had this type of stomach pain before?" If Yes, ask: "When was the last time?" and "What happened that time?"  ?    *No Answer* ?8. CAUSE: "What do you think is causing the stomach pain?" ?    *No Answer* ?9. RELIEVING/AGGRAVATING FACTORS: "What makes it better or worse?" (e.g., movement, antacids, bowel movement) ?    *No Answer* ?10. OTHER SYMPTOMS: "Do you have any other symptoms?" (e.g., back pain, diarrhea, fever, urination  pain, vomiting) ?      *No Answer* ?11. PREGNANCY: "Is there any chance you are pregnant?" "When was your last menstrual period?" ?      *No Answer* ? ?Protocols used: Abdominal Pain - Female-A-AH ? ?

## 2021-08-23 NOTE — Telephone Encounter (Signed)
?  Chief Complaint: abd pain for several weeks ?Symptoms: bloating, swelling, pain in upper stomach, cramping too upper stomach ?Frequency: For several weeks getting worse over time ?Pertinent Negatives: Patient denies diarrhea, vomiting, recent illness ?Disposition: [] ED /[] Urgent Care (no appt availability in office) / Appointment(In office/virtual)/ []  [facility]/ [] Home Care/ [] Refused Recommended Disposition /[] [facility]/ []  Follow-up with PCP ?Additional Notes: Appt made with for 09/18/2021 at 2:10 with instructions to go to the urgent care or ED if she gets worse.   Pt was agreeable to this plan.    ?

## 2021-09-18 ENCOUNTER — Ambulatory Visit: Payer: Medicaid Other | Admitting: Physician Assistant

## 2021-10-10 ENCOUNTER — Encounter: Payer: Self-pay | Admitting: Physician Assistant

## 2021-10-10 ENCOUNTER — Ambulatory Visit: Payer: Medicaid Other | Attending: Physician Assistant | Admitting: Physician Assistant

## 2021-10-10 ENCOUNTER — Other Ambulatory Visit: Payer: Self-pay

## 2021-10-10 VITALS — BP 128/81 | HR 98 | Wt 233.6 lb

## 2021-10-10 DIAGNOSIS — R519 Headache, unspecified: Secondary | ICD-10-CM

## 2021-10-10 DIAGNOSIS — Z3202 Encounter for pregnancy test, result negative: Secondary | ICD-10-CM | POA: Diagnosis not present

## 2021-10-10 DIAGNOSIS — T7840XD Allergy, unspecified, subsequent encounter: Secondary | ICD-10-CM

## 2021-10-10 DIAGNOSIS — R1013 Epigastric pain: Secondary | ICD-10-CM | POA: Diagnosis not present

## 2021-10-10 DIAGNOSIS — J302 Other seasonal allergic rhinitis: Secondary | ICD-10-CM | POA: Diagnosis not present

## 2021-10-10 DIAGNOSIS — J3089 Other allergic rhinitis: Secondary | ICD-10-CM | POA: Diagnosis not present

## 2021-10-10 DIAGNOSIS — F32A Depression, unspecified: Secondary | ICD-10-CM | POA: Diagnosis not present

## 2021-10-10 DIAGNOSIS — F419 Anxiety disorder, unspecified: Secondary | ICD-10-CM

## 2021-10-10 LAB — POCT URINALYSIS DIP (CLINITEK)
Bilirubin, UA: NEGATIVE
Blood, UA: NEGATIVE
Glucose, UA: NEGATIVE mg/dL
Ketones, POC UA: NEGATIVE mg/dL
Leukocytes, UA: NEGATIVE
Nitrite, UA: NEGATIVE
POC PROTEIN,UA: NEGATIVE
Spec Grav, UA: >=1.03 — AB (ref 1.010–1.025)
Urobilinogen, UA: 0.2 E.U./dL
pH, UA: 6 (ref 5.0–8.0)

## 2021-10-10 LAB — POCT URINE PREGNANCY: Preg Test, Ur: NEGATIVE

## 2021-10-10 MED ORDER — FLUTICASONE PROPIONATE 50 MCG/ACT NA SUSP
2.0000 | Freq: Every day | NASAL | 12 refills | Status: AC
  Filled 2021-10-10: qty 16, 30d supply, fill #0

## 2021-10-10 MED ORDER — CETIRIZINE HCL 10 MG PO TABS
10.0000 mg | ORAL_TABLET | Freq: Every day | ORAL | 11 refills | Status: AC
  Filled 2021-10-10: qty 30, 30d supply, fill #0

## 2021-10-10 MED ORDER — ESCITALOPRAM OXALATE 10 MG PO TABS
10.0000 mg | ORAL_TABLET | Freq: Every day | ORAL | 2 refills | Status: AC
  Filled 2021-10-10: qty 30, 30d supply, fill #0

## 2021-10-10 NOTE — Progress Notes (Signed)
Patient ID: Tiffany Harris, female   DOB: [birth year], 22 y.o.   MRN: [id number] ? ?Tiffany Harris, is a 21 y.o. female ? ?[id number] ? ?[id number] ? ?DOB - March 10, 2001 ? ?Chief Complaint  ?Patient presents with  ? Abdominal Pain  ? Headache  ? Medication Refill  ?    ? ?Subjective:  ? ?Tiffany Harris is a 21 y.o. female here today for several issues. ?Abd pain in midepigastric area, worsens after meals.   ?No change in stools.  Advil helps a little. No blood in stools.  Appetite is good.  No fever.  No melena/hematochezia.  This has been going on about 2 months ? ?LMP: last month EOM.   ? ?New schedule 12 hour shifts at night(since January).  4 nights. Then off 3 days.  Sleep pattern is irregular.  Usually sleeps about 5 hours.   ? ?C/o frequent HA.  Some sinus congestion and scratchy throat.  No fevers.  This has been going on about 3 months.. ? ?Depression screening positive with #9=1.  She says she only gets this way when out of lexapro.  She says when she is out of lexapro she has fleeting and passive SI.  She contracts for safety and has supportive friendships and her bf whom she lives with is supportive.  "I just need to get back on my meds." ?No problems updated. ? ?ALLERGIES: ?Allergies  ?Allergen Reactions  ? Pineapple Flavor [Flavoring Agent]   ?  Face bumps and itchy  ? ? ?PAST MEDICAL HISTORY: ?Past Medical History:  ?Diagnosis Date  ? Anxiety   ? Depression   ? ? ?MEDICATIONS AT HOME: ?Prior to Admission medications   ?Medication Sig Start Date End Date Taking? Authorizing Provider  ?benzonatate (TESSALON) 100 MG capsule Take 2 capsules (200 mg total) by mouth 3 (three) times daily as needed for cough. 04/23/20  Yes Claiborne Rigg, NP  ?cetirizine (ZYRTEC) 10 MG tablet Take 1 tablet (10 mg total) by mouth daily. 10/10/21  Yes Anders Simmonds, PA-C  ?escitalopram (LEXAPRO) 10 MG tablet Take 1 tablet (10 mg total) by mouth daily. 10/10/21   Anders Simmonds, PA-C  ?fluticasone (FLONASE) 50  MCG/ACT nasal spray Place 2 sprays into both nostrils daily. 10/10/21   Anders Simmonds, PA-C  ?hydrOXYzine (ATARAX/VISTARIL) 25 MG tablet Take 25 mg by mouth. One capsule up to twice a day as needed for anxiety    [provider]  ?tizanidine (ZANAFLEX) 2 MG capsule Take 2 mg by mouth every 8 (eight) hours.    [provider]  ? ? ?ROS: ?Neg HEENT ?Neg resp ?Neg cardiac ?Neg GI ?Neg MS ? ?Objective:  ? ?Vitals:  ? 10/10/21 1037  ?BP: 128/81  ?Pulse: 98  ?SpO2: 98%  ?Weight: 233 lb 9.6 oz (106 kg)  ? ?Exam ?General appearance : Awake, alert, not in any distress. Speech Clear. Not toxic looking ?HEENT: Atraumatic and Normocephalic, pupils equally reactive to light and accomodation, TM full and congested B, turbinates enlarged and boggy.  Throat with PND-no erythema or exudate.   ?Neck: Supple, no JVD. No cervical lymphadenopathy.  ?Chest: Good air entry bilaterally, CTAB.  No rales/rhonchi/wheezing ?CVS: S1 S2 regular, no murmurs.  ?Abdomen-TTP in midepigastric area, non-distended, bowel sounds normal.  No other tenderness.   ?Extremities: B/L Lower Ext shows no edema, both legs are warm to touch ?Neurology: Awake alert, and oriented X 3, CN II-XII intact, Non focal ?Skin: No Rash ? ?  10/10/2021  ? 10:41  AM 01/18/2020  ?  8:49 AM 08/17/2017  ?  1:38 PM  ?Depression screen PHQ 2/9  ?Decreased Interest 0 0 2  ?Down, Depressed, Hopeless 2 0 2  ?PHQ - 2 Score 2 0 4  ?Altered sleeping 3 0 2  ?Tired, decreased energy 3 0 2  ?Change in appetite 3 0 2  ?Feeling bad or failure about yourself  1 1 2   ?Trouble concentrating 0 0 2  ?Moving slowly or fidgety/restless 2 0 2  ?Suicidal thoughts 1 0 2  ?PHQ-9 Score 15 1 18   ? ? ? ?Data Review ?Lab Results  ?Component Value Date  ? HGBA1C 5.9 (H) 01/20/2020  ? ? ?Assessment & Plan  ? ?1. Midepigastric pain ?Non-acute abdomen ?- H. pylori breath test ?- Comprehensive metabolic panel ?- CBC with Differential/Platelet ?- Thyroid Panel With TSH ?- POCT URINALYSIS DIP  (CLINITEK) ?- POCT urine pregnancy ? ?2. Nonintractable headache, unspecified chronicity pattern, unspecified headache type ?No red flags-for now continue advil and I believe the flonase and allergy meds will help too ?- CBC with Differential/Platelet ?- Thyroid Panel With TSH ? ?3. Allergy, subsequent encounter ?- fluticasone (FLONASE) 50 MCG/ACT nasal spray; Place 2 sprays into both nostrils daily.  Dispense: 16 g; Refill: 12 ?- cetirizine (ZYRTEC) 10 MG tablet; Take 1 tablet (10 mg total) by mouth daily.  Dispense: 30 tablet; Refill: 11 ? ?4. Anxiety and depression ?Resume-contracts for safety.  She has been off meds and does much better on meds ?- escitalopram (LEXAPRO) 10 MG tablet; Take 1 tablet (10 mg total) by mouth daily.  Dispense: 90 tablet; Refill: 2 ? ?5. Allergic rhinitis due to other allergic trigger, unspecified seasonality ?- fluticasone (FLONASE) 50 MCG/ACT nasal spray; Place 2 sprays into both nostrils daily.  Dispense: 16 g; Refill: 12 ?- cetirizine (ZYRTEC) 10 MG tablet; Take 1 tablet (10 mg total) by mouth daily.  Dispense: 30 tablet; Refill: 11 ? ? ?Return in about 6 weeks (around 11/21/2021) for recehck with PCP on lexapro. ? ?The patient was given clear instructions to go to ER or return to medical center if symptoms don't improve, worsen or new problems develop. The patient verbalized understanding. The patient was told to call to get lab results if they haven't heard anything in the next week.  ? ? ? ? ?01/22/2020, PA-C ? [facility] ?[place], [facility] ?[phone]   ?10/10/2021, 12:54 PM  ?

## 2021-10-11 LAB — CBC WITH DIFFERENTIAL/PLATELET
Basophils Absolute: 0 10*3/uL (ref 0.0–0.2)
Basos: 1 %
EOS (ABSOLUTE): 0.1 10*3/uL (ref 0.0–0.4)
Eos: 2 %
Hematocrit: 38 % (ref 34.0–46.6)
Hemoglobin: 12.2 g/dL (ref 11.1–15.9)
Immature Grans (Abs): 0 10*3/uL (ref 0.0–0.1)
Immature Granulocytes: 0 %
Lymphocytes Absolute: 2.7 10*3/uL (ref 0.7–3.1)
Lymphs: 44 %
MCH: 25.7 pg — ABNORMAL LOW (ref 26.6–33.0)
MCHC: 32.1 g/dL (ref 31.5–35.7)
MCV: 80 fL (ref 79–97)
Monocytes Absolute: 0.6 10*3/uL (ref 0.1–0.9)
Monocytes: 10 %
Neutrophils Absolute: 2.6 10*3/uL (ref 1.4–7.0)
Neutrophils: 43 %
Platelets: 321 10*3/uL (ref 150–450)
RBC: 4.74 x10E6/uL (ref 3.77–5.28)
RDW: 13.9 % (ref 11.7–15.4)
WBC: 6 10*3/uL (ref 3.4–10.8)

## 2021-10-11 LAB — COMPREHENSIVE METABOLIC PANEL
ALT: 14 IU/L (ref 0–32)
AST: 14 IU/L (ref 0–40)
Albumin/Globulin Ratio: 1.4 (ref 1.2–2.2)
Albumin: 4.3 g/dL (ref 3.9–5.0)
Alkaline Phosphatase: 67 IU/L (ref 44–121)
BUN/Creatinine Ratio: 17 (ref 9–23)
BUN: 12 mg/dL (ref 6–20)
Bilirubin Total: <0.2 mg/dL (ref 0.0–1.2)
CO2: 21 mmol/L (ref 20–29)
Calcium: 9.7 mg/dL (ref 8.7–10.2)
Chloride: 106 mmol/L (ref 96–106)
Creatinine, Ser: 0.72 mg/dL (ref 0.57–1.00)
Globulin, Total: 3 g/dL (ref 1.5–4.5)
Glucose: 99 mg/dL (ref 70–99)
Potassium: 4.4 mmol/L (ref 3.5–5.2)
Sodium: 142 mmol/L (ref 134–144)
Total Protein: 7.3 g/dL (ref 6.0–8.5)
eGFR: 121 mL/min/{1.73_m2} (ref >59)

## 2021-10-11 LAB — THYROID PANEL WITH TSH
Free Thyroxine Index: 2.7 (ref 1.2–4.9)
T3 Uptake Ratio: 39 % (ref 24–39)
T4, Total: 7 ug/dL (ref 4.5–12.0)
TSH: 1.17 u[IU]/mL (ref 0.450–4.500)

## 2021-10-12 LAB — H. PYLORI BREATH TEST: H pylori Breath Test: NEGATIVE

## 2021-11-26 ENCOUNTER — Ambulatory Visit: Payer: Medicaid Other | Attending: Nurse Practitioner | Admitting: Nurse Practitioner

## 2021-11-26 DIAGNOSIS — F419 Anxiety disorder, unspecified: Secondary | ICD-10-CM

## 2021-11-26 NOTE — Progress Notes (Signed)
[phone]  Call can not be completed at this time.  Unable to leave voicemail

## 2022-01-13 ENCOUNTER — Encounter: Payer: Medicaid Other | Admitting: Radiology

## 2022-07-25 DIAGNOSIS — Z6830 Body mass index (BMI) 30.0-30.9, adult: Secondary | ICD-10-CM | POA: Diagnosis not present

## 2022-07-25 DIAGNOSIS — J209 Acute bronchitis, unspecified: Secondary | ICD-10-CM | POA: Diagnosis not present

## 2022-07-25 DIAGNOSIS — R059 Cough, unspecified: Secondary | ICD-10-CM | POA: Diagnosis not present

## 2022-08-22 DIAGNOSIS — Z0000 Encounter for general adult medical examination without abnormal findings: Secondary | ICD-10-CM | POA: Diagnosis not present

## 2022-08-22 DIAGNOSIS — Z6831 Body mass index (BMI) 31.0-31.9, adult: Secondary | ICD-10-CM | POA: Diagnosis not present

## 2022-08-22 DIAGNOSIS — Z7251 High risk heterosexual behavior: Secondary | ICD-10-CM | POA: Diagnosis not present

## 2022-08-22 DIAGNOSIS — R5383 Other fatigue: Secondary | ICD-10-CM | POA: Diagnosis not present

## 2022-08-22 DIAGNOSIS — Z975 Presence of (intrauterine) contraceptive device: Secondary | ICD-10-CM | POA: Diagnosis not present

## 2022-08-28 DIAGNOSIS — R509 Fever, unspecified: Secondary | ICD-10-CM | POA: Diagnosis not present

## 2022-08-28 DIAGNOSIS — U071 COVID-19: Secondary | ICD-10-CM | POA: Diagnosis not present

## 2022-08-28 DIAGNOSIS — Z20822 Contact with and (suspected) exposure to covid-19: Secondary | ICD-10-CM | POA: Diagnosis not present

## 2022-09-04 DIAGNOSIS — Z20822 Contact with and (suspected) exposure to covid-19: Secondary | ICD-10-CM | POA: Diagnosis not present
